# Patient Record
Sex: Male | Born: 1995 | Race: White | Hispanic: No | Marital: Single | Smoking: Current some day smoker
Health system: Southern US, Community
[De-identification: ages and names within clinical notes are randomized; demographics above are authoritative.]

---
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
## 2016-07-27 IMAGING — CR DG FINGER RING 2+V*R*
1 series · 3 of 3 positions shown · non-contrast
Comparison: None.

CLINICAL DATA: Pain, injury, swelling and laceration. Nail injury.
Injury to the ring and middle fingers.

EXAM:
RIGHT RING FINGER 2+V

[Series 1: x finger pa right · 0.14mm/px · 3 of 3 slices shown]
[im 1/3]
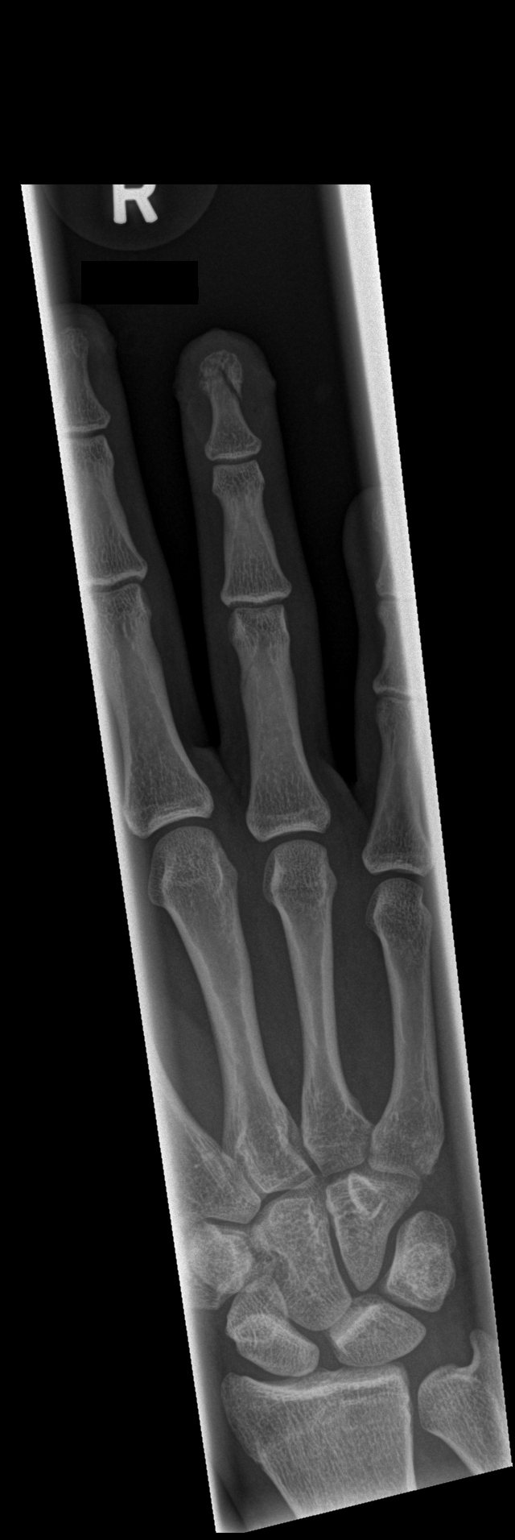
[im 2/3]
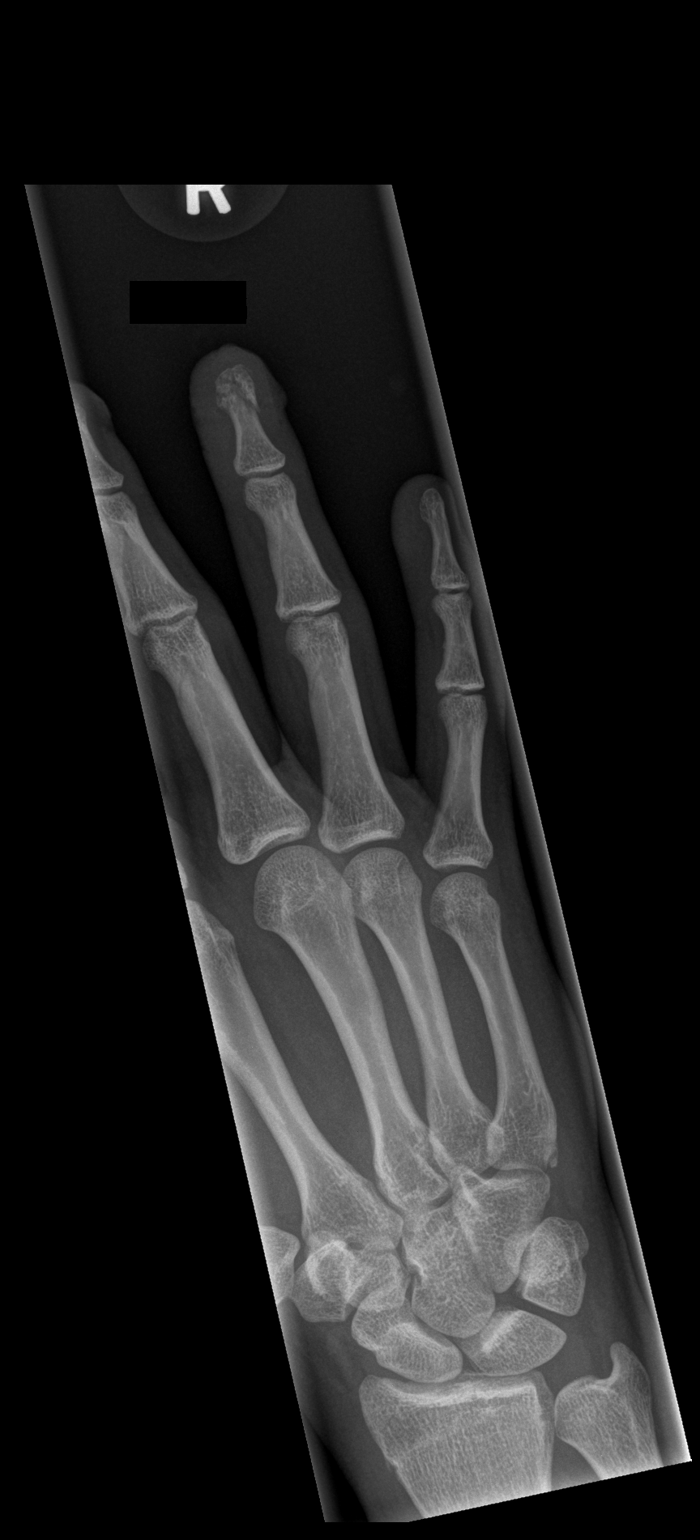
[im 3/3]
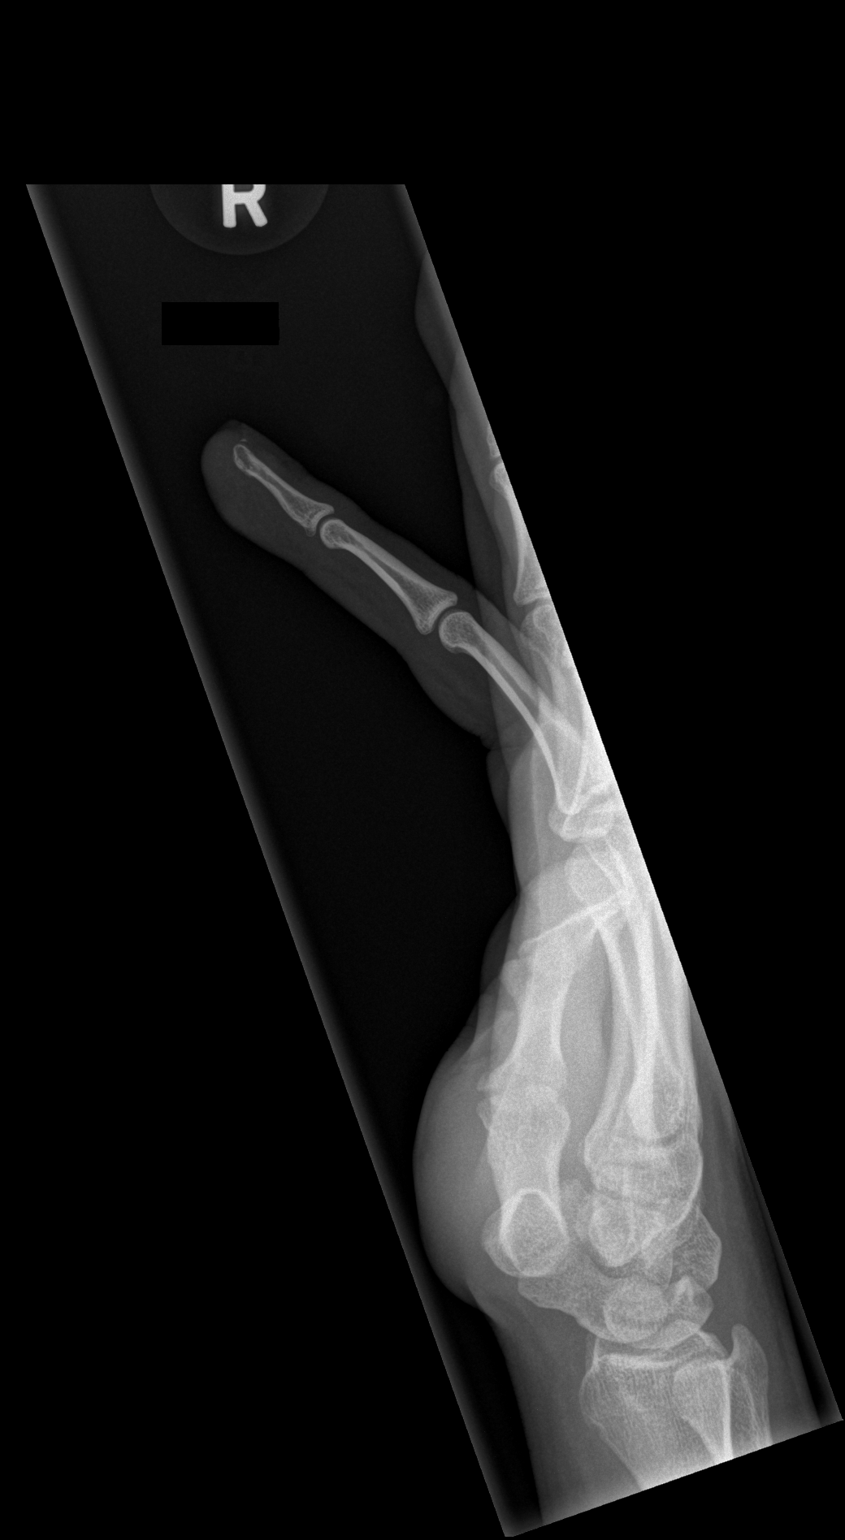

[3 of 3 positions shown; findings below may reference images not displayed]

FINDINGS: There is a mildly comminuted nondisplaced fracture of the ring
finger distal tuft. No intra-articular extension. No additional
acute fracture. No evidence of radiopaque foreign body. Soft tissue
edema/ laceration noted in the region of the nailbed.
IMPRESSION: Mildly comminuted nondisplaced ring finger distal tuft fracture.

## 2016-07-27 IMAGING — CR DG FINGER MIDDLE 2+V*R*
1 series · 3 of 3 positions shown · non-contrast
Comparison: None.

CLINICAL DATA: Laceration of the right third and fourth digits.
Initial encounter.

EXAM:
RIGHT MIDDLE FINGER 2+V

[Series 1: x finger pa right · 0.14mm/px · 3 of 3 slices shown]
[im 1/3]
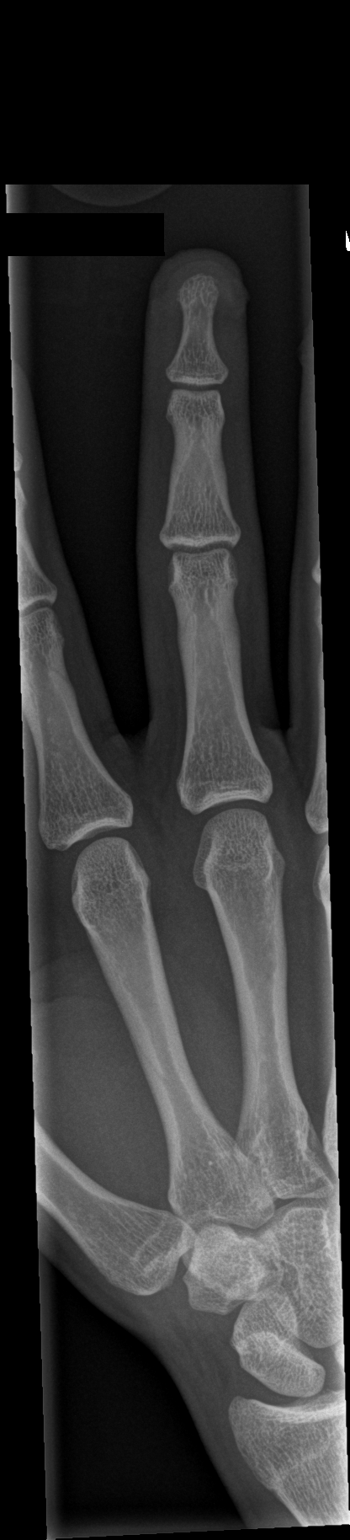
[im 2/3]
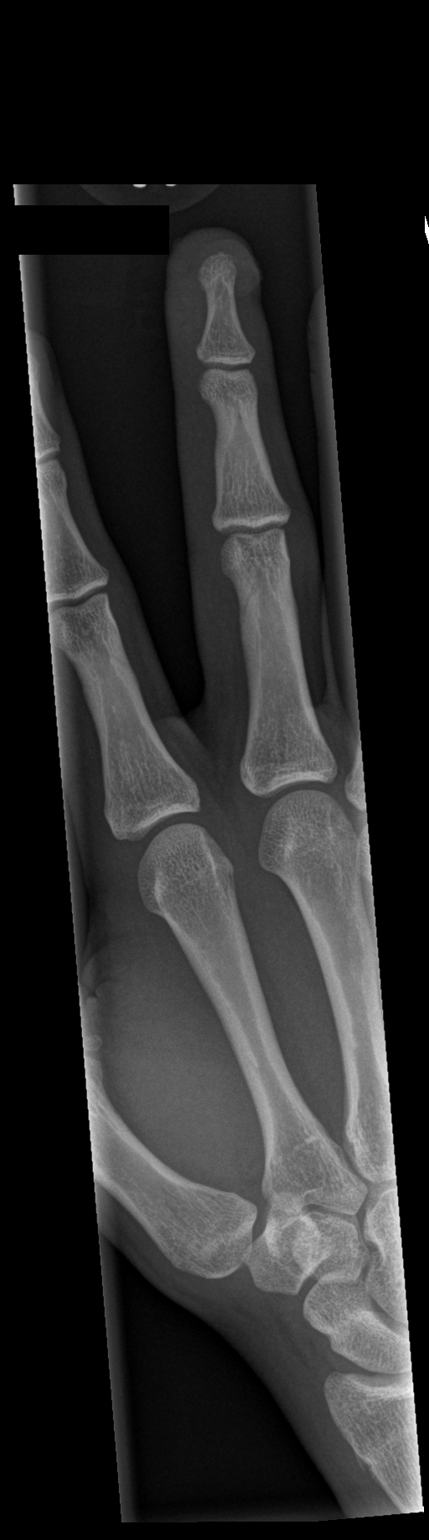
[im 3/3]
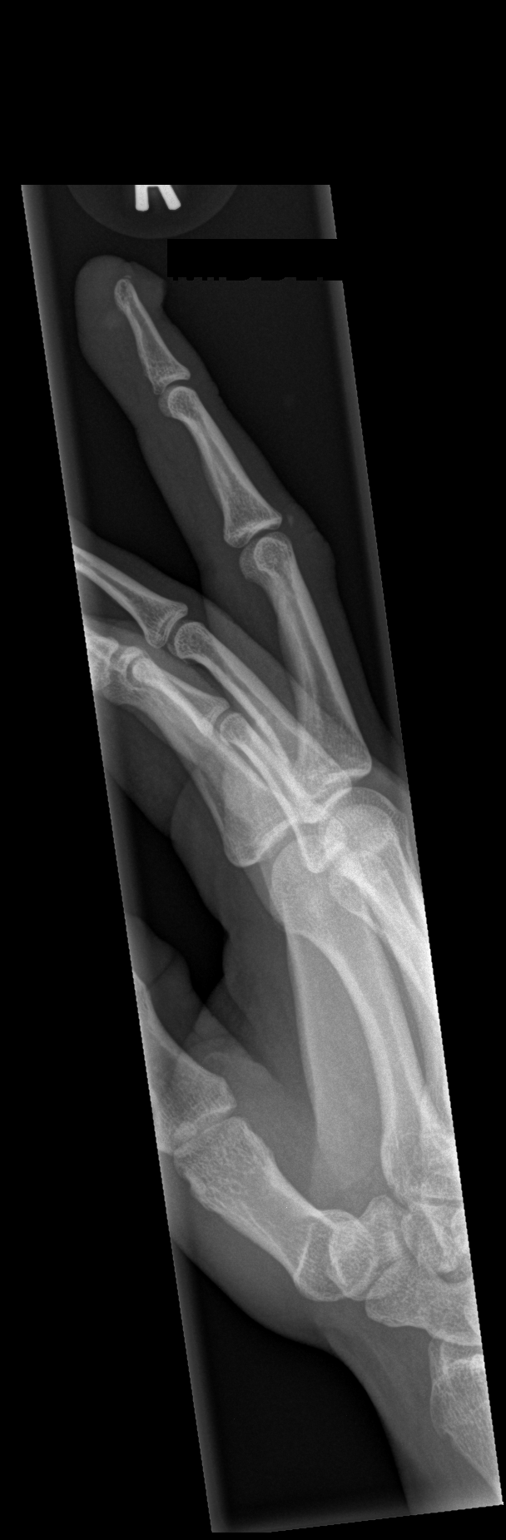

[3 of 3 positions shown; findings below may reference images not displayed]

FINDINGS: There is a tiny chip fracture through the tuft seen in the lateral
projection, neighboring the nail bed injury. Triangular density
dorsal to the middle finger PIP joint which measures 2 mm, not seen
in the AP projection but potentially obscured by osseous structures.
No dislocation.
IMPRESSION: 1. Tiny chip fracture from the middle finger tuft.
2. 2 mm density dorsal to the middle finger PIP joint. If
neighboring soft tissue injury this is consistent with foreign body.

## 2017-07-19 ENCOUNTER — Encounter: Payer: Self-pay | Admitting: Emergency Medicine

## 2017-07-19 ENCOUNTER — Emergency Department
Admission: EM | Admit: 2017-07-19 | Discharge: 2017-07-19 | Disposition: A | Discharge status: Home / Self Care | Payer: 59 | Attending: Emergency Medicine | Admitting: Emergency Medicine

## 2017-07-19 DIAGNOSIS — F1310 Sedative, hypnotic or anxiolytic abuse, uncomplicated: Secondary | ICD-10-CM | POA: Diagnosis not present

## 2017-07-19 DIAGNOSIS — F17210 Nicotine dependence, cigarettes, uncomplicated: Secondary | ICD-10-CM | POA: Diagnosis not present

## 2017-07-19 DIAGNOSIS — F19239 Other psychoactive substance dependence with withdrawal, unspecified: Secondary | ICD-10-CM | POA: Diagnosis not present

## 2017-07-19 DIAGNOSIS — F19939 Other psychoactive substance use, unspecified with withdrawal, unspecified: Secondary | ICD-10-CM

## 2017-07-19 DIAGNOSIS — R569 Unspecified convulsions: Secondary | ICD-10-CM | POA: Diagnosis not present

## 2017-07-19 DIAGNOSIS — T424X6A Underdosing of benzodiazepines, initial encounter: Secondary | ICD-10-CM | POA: Insufficient documentation

## 2017-07-19 LAB — URINE DRUG SCREEN, QUALITATIVE (ARMC ONLY)
AMPHETAMINES, UR SCREEN: POSITIVE — AB
BENZODIAZEPINE, UR SCRN: POSITIVE — AB
Barbiturates, Ur Screen: NOT DETECTED
Cannabinoid 50 Ng, Ur ~~LOC~~: POSITIVE — AB
Cocaine Metabolite,Ur ~~LOC~~: POSITIVE — AB
MDMA (Ecstasy)Ur Screen: NOT DETECTED
METHADONE SCREEN, URINE: NOT DETECTED
Opiate, Ur Screen: NOT DETECTED
PHENCYCLIDINE (PCP) UR S: NOT DETECTED
Tricyclic, Ur Screen: NOT DETECTED

## 2017-07-19 LAB — ETHANOL

## 2017-07-19 LAB — BASIC METABOLIC PANEL
Anion gap: 13 (ref 5–15)
BUN: 11 mg/dL (ref 6–20)
CALCIUM: 9 mg/dL (ref 8.9–10.3)
CO2: 19 mmol/L — ABNORMAL LOW (ref 22–32)
Chloride: 104 mmol/L (ref 101–111)
Creatinine, Ser: 1.07 mg/dL (ref 0.61–1.24)
Glucose, Bld: 197 mg/dL — ABNORMAL HIGH (ref 65–99)
Potassium: 3 mmol/L — ABNORMAL LOW (ref 3.5–5.1)
Sodium: 136 mmol/L (ref 135–145)

## 2017-07-19 LAB — CBC
HCT: 42.2 % (ref 40.0–52.0)
Hemoglobin: 14.9 g/dL (ref 13.0–18.0)
MCH: 31.5 pg (ref 26.0–34.0)
MCHC: 35.3 g/dL (ref 32.0–36.0)
MCV: 89.2 fL (ref 80.0–100.0)
PLATELETS: 265 10*3/uL (ref 150–440)
RBC: 4.73 MIL/uL (ref 4.40–5.90)
RDW: 14.2 % (ref 11.5–14.5)
WBC: 9.4 10*3/uL (ref 3.8–10.6)

## 2017-07-19 MED ORDER — LORAZEPAM 2 MG PO TABS: 0.0000 mg | ORAL_TABLET | Freq: Two times a day (BID) | ORAL | Status: DC

## 2017-07-19 MED ORDER — SODIUM CHLORIDE 0.9 % IV BOLUS (SEPSIS)
1000.0000 mL | Freq: Once | INTRAVENOUS | Status: AC
  Administered 2017-07-19: 1000 mL via INTRAVENOUS

## 2017-07-19 MED ORDER — LORAZEPAM 2 MG/ML IJ SOLN
0.5000 mg | Freq: Once | INTRAMUSCULAR | Status: AC
  Administered 2017-07-19: 0.5 mg via INTRAVENOUS
  Filled 2017-07-19: qty 1

## 2017-07-19 MED ORDER — LORAZEPAM 2 MG PO TABS: 0.0000 mg | ORAL_TABLET | Freq: Four times a day (QID) | ORAL | Status: DC

## 2017-07-19 MED ORDER — LORAZEPAM 2 MG/ML IJ SOLN: 0.0000 mg | Freq: Four times a day (QID) | INTRAMUSCULAR | Status: DC

## 2017-07-19 MED ORDER — LORAZEPAM 2 MG/ML IJ SOLN: 0.0000 mg | Freq: Two times a day (BID) | INTRAMUSCULAR | Status: DC

## 2017-07-19 MED ORDER — CHLORDIAZEPOXIDE HCL 25 MG PO CAPS
25.0000 mg | ORAL_CAPSULE | Freq: Once | ORAL | Status: AC
  Administered 2017-07-19: 25 mg via ORAL
  Filled 2017-07-19: qty 1

## 2017-07-19 MED ORDER — CHLORDIAZEPOXIDE HCL 10 MG PO CAPS: ORAL_CAPSULE | ORAL | 0 refills | Status: AC

## 2017-07-19 NOTE — ED Triage Notes (Signed)
Pt presents to ED via EMS from [place] c/o seizure that lasted 2 minutes. Pt was lethargic when EMS arrived; now A/O x4

## 2017-07-19 NOTE — ED Notes (Signed)
Pt admits to using large amounts of xanax. Pt explained that he was with friends at the time. Pt also admits to using marijuana , and "other common drugs that kids dressed like him do on [employer] [facility]".

## 2017-07-19 NOTE — ED Notes (Signed)
MD at bedside for update 

## 2017-07-19 NOTE — ED Notes (Signed)
Parents brought back by officer to room per patient request.

## 2017-07-19 NOTE — ED Provider Notes (Signed)
[facility] Emergency Department Provider Note  Time seen: 9:08 PM  I have reviewed the triage vital signs and the nursing notes.   HISTORY  Chief Complaint Seizures    HPI Barry Roth is a 21 y.o. male With a past medical history of ADHD who presents to the emergency department after seizure. According to the patient in confidentiality, he states he has been using Xanax on-and-off for the past 3 years, but nearly daily for the past several weeks to months. Patient states yesterday he decided he wanted to stop using Xanax so he flushed all the Xanax he had down the toilet.patient has not had any Xanax for 2 days. Today the patient states he awoke on the floor feeling extremely confused on on when it happened, report is generalized tonic-clonic seizure activity.patient denies any alcohol use today. Denies any prior seizures.  History reviewed. No pertinent past medical history.  There are no active problems to display for this patient.   History reviewed. No pertinent surgical history.  Prior to Admission medications   Medication Sig Start Date End Date Taking? Authorizing Provider  oxyCODONE-acetaminophen (ROXICET) 5-325 MG tablet Take 1 tablet by mouth every 6 (six) hours as needed for severe pain. 11/21/15   Cuthriell, Delorise Royals, PA-C    No Known Allergies  History reviewed. No pertinent family history.  Social History Social History  Substance Use Topics  . Smoking status: Current Some Day Smoker    Types: Cigarettes  . Smokeless tobacco: Current User  . Alcohol use Yes    Review of Systems Constitutional: Negative for fever. Cardiovascular: Negative for chest pain. Respiratory: Negative for shortness of breath. Gastrointestinal: Negative for abdominal pain Neurological: Negative for headache All other ROS negative  ____________________________________________   PHYSICAL EXAM:  VITAL SIGNS: ED Triage Vitals  Enc Vitals Group     BP  07/19/17 2030 (!) 156/91     Pulse Rate 07/19/17 2030 73     Resp 07/19/17 2030 19     Temp 07/19/17 2030 98.4 F (36.9 C)     Temp Source 07/19/17 2030 Oral     SpO2 07/19/17 2030 98 %     Weight 07/19/17 2029 185 lb (83.9 kg)     Height 07/19/17 2029  (1.778 m)     Head Circumference --      Peak Flow --      Pain Score --      Pain Loc --      Pain Edu? --      Excl. in GC? --     Constitutional: Alert and oriented. Well appearing and in no distress. Eyes: Normal exam ENT   Head: Normocephalic and atraumatic   Mouth/Throat: Mucous membranes are moist. Cardiovascular: Normal rate, regular rhythm. No murmur Respiratory: Normal respiratory effort without tachypnea nor retractions. Breath sounds are clear Gastrointestinal: Soft and nontender. No distention.  Musculoskeletal: Nontender with normal range of motion in all extremities. Neurologic:  Normal speech and language. No gross focal neurologic deficits Skin:  Skin is warm, dry and intact.  Psychiatric: tearful at times.  ____________________________________________  EKG reviewed and interpreted by myself shows sinus tachycardia 101 bpm, slightly widened QRS with a normal axis, largely normal intervals, nonspecific but no concerning ST changes.   INITIAL IMPRESSION / ASSESSMENT AND PLAN / ED COURSE  Pertinent labs & imaging results that were available during my care of the patient were reviewed by me and considered in my medical decision making (see  chart for details).  patient presents to the emergency department after reported seizure-like activity.  Patient admits to using Xanax on a near daily basis for the past several weeks. Differential this time would include benzodiazepine related withdrawal seizure, alcohol withdrawal seizure, epileptic seizure, metabolic abnormality, syncopal episode. We will check labs, EKG and closely monitor. We will dose IV Ativan and IV fluids, placed on CIWA protocol and continue  to closely monitor. Patient agreeable to plan.  patient's urine toxicology positive for amphetamines, cocaine, benzodiazepines, cannabinoids. Blood work is largely within normal limits including a negative alcohol level. Patient's family is here with him. I had a long discussion with the patient as well as his family with the patient's permission. I recommended admission to the hospital given benzodiazepine withdrawal seizure. Patient adamantly does not wish to be admitted to the hospital. I had a long discussion with him and his family. The family is going to stay with the patient. We will discharge on a Librium taper. I discussed if there is any further seizures they are to immediately return to the emergency department they're agreeable to this plan.  ____________________________________________   FINAL CLINICAL IMPRESSION(S) / ED DIAGNOSES  seizure benzodiazepine withdrawal    Minna Antis, MD 07/19/17 2324
